# Patient Record
Sex: Female | Born: 1990 | Race: White | Hispanic: No
Health system: Southern US, Community
[De-identification: ages and names within clinical notes are randomized; demographics above are authoritative.]

---
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
## 2016-07-22 ENCOUNTER — Other Ambulatory Visit (HOSPITAL_COMMUNITY)
Admission: RE | Admit: 2016-07-22 | Discharge: 2016-07-22 | Disposition: A | Discharge status: Home / Self Care | Payer: 59 | Source: Ambulatory Visit | Attending: Nurse Practitioner | Admitting: Nurse Practitioner

## 2016-07-22 ENCOUNTER — Other Ambulatory Visit: Payer: Self-pay | Admitting: Nurse Practitioner

## 2016-07-22 DIAGNOSIS — Z01419 Encounter for gynecological examination (general) (routine) without abnormal findings: Secondary | ICD-10-CM | POA: Insufficient documentation

## 2016-07-22 DIAGNOSIS — Z113 Encounter for screening for infections with a predominantly sexual mode of transmission: Secondary | ICD-10-CM | POA: Insufficient documentation

## 2016-07-24 LAB — CYTOLOGY - PAP

## 2017-05-29 DIAGNOSIS — Z800 Family history of malignant neoplasm of digestive organs: Secondary | ICD-10-CM | POA: Diagnosis not present

## 2017-05-29 DIAGNOSIS — K580 Irritable bowel syndrome with diarrhea: Secondary | ICD-10-CM | POA: Diagnosis not present

## 2017-06-24 DIAGNOSIS — R1013 Epigastric pain: Secondary | ICD-10-CM | POA: Diagnosis not present

## 2017-07-02 DIAGNOSIS — R7989 Other specified abnormal findings of blood chemistry: Secondary | ICD-10-CM | POA: Diagnosis not present

## 2017-07-02 DIAGNOSIS — R5383 Other fatigue: Secondary | ICD-10-CM | POA: Diagnosis not present

## 2017-07-02 DIAGNOSIS — E010 Iodine-deficiency related diffuse (endemic) goiter: Secondary | ICD-10-CM | POA: Diagnosis not present

## 2017-07-02 DIAGNOSIS — E559 Vitamin D deficiency, unspecified: Secondary | ICD-10-CM | POA: Diagnosis not present

## 2017-07-02 DIAGNOSIS — R946 Abnormal results of thyroid function studies: Secondary | ICD-10-CM | POA: Diagnosis not present

## 2017-07-03 ENCOUNTER — Other Ambulatory Visit: Payer: Self-pay | Admitting: Family Medicine

## 2017-07-03 DIAGNOSIS — E010 Iodine-deficiency related diffuse (endemic) goiter: Secondary | ICD-10-CM

## 2017-07-13 ENCOUNTER — Ambulatory Visit
Admission: RE | Admit: 2017-07-13 | Discharge: 2017-07-13 | Disposition: A | Discharge status: Home / Self Care | Payer: 59 | Source: Ambulatory Visit | Attending: Family Medicine | Admitting: Family Medicine

## 2017-07-13 DIAGNOSIS — E010 Iodine-deficiency related diffuse (endemic) goiter: Secondary | ICD-10-CM | POA: Diagnosis not present

## 2017-07-18 DIAGNOSIS — J0140 Acute pansinusitis, unspecified: Secondary | ICD-10-CM | POA: Diagnosis not present

## 2017-07-20 DIAGNOSIS — D126 Benign neoplasm of colon, unspecified: Secondary | ICD-10-CM | POA: Diagnosis not present

## 2017-07-20 DIAGNOSIS — K580 Irritable bowel syndrome with diarrhea: Secondary | ICD-10-CM | POA: Diagnosis not present

## 2017-07-20 DIAGNOSIS — Z800 Family history of malignant neoplasm of digestive organs: Secondary | ICD-10-CM | POA: Diagnosis not present

## 2017-07-23 DIAGNOSIS — Z01419 Encounter for gynecological examination (general) (routine) without abnormal findings: Secondary | ICD-10-CM | POA: Diagnosis not present

## 2017-08-18 DIAGNOSIS — E02 Subclinical iodine-deficiency hypothyroidism: Secondary | ICD-10-CM | POA: Diagnosis not present

## 2017-08-18 DIAGNOSIS — E063 Autoimmune thyroiditis: Secondary | ICD-10-CM | POA: Diagnosis not present

## 2017-08-18 DIAGNOSIS — E049 Nontoxic goiter, unspecified: Secondary | ICD-10-CM | POA: Diagnosis not present

## 2018-09-07 ENCOUNTER — Other Ambulatory Visit: Payer: Self-pay | Admitting: Nurse Practitioner

## 2019-09-08 ENCOUNTER — Other Ambulatory Visit: Payer: Self-pay

## 2019-09-08 DIAGNOSIS — Z20822 Contact with and (suspected) exposure to covid-19: Secondary | ICD-10-CM

## 2019-09-10 LAB — NOVEL CORONAVIRUS, NAA: SARS-CoV-2, NAA: NOT DETECTED

## 2019-09-12 ENCOUNTER — Other Ambulatory Visit: Payer: Self-pay | Admitting: Nurse Practitioner

## 2019-09-12 ENCOUNTER — Other Ambulatory Visit (HOSPITAL_COMMUNITY)
Admission: RE | Admit: 2019-09-12 | Discharge: 2019-09-12 | Disposition: A | Discharge status: Home / Self Care | Payer: 59 | Source: Ambulatory Visit | Attending: Nurse Practitioner | Admitting: Nurse Practitioner

## 2019-09-12 DIAGNOSIS — Z124 Encounter for screening for malignant neoplasm of cervix: Secondary | ICD-10-CM | POA: Diagnosis present

## 2019-09-14 LAB — CYTOLOGY - PAP: Diagnosis: NEGATIVE

## 2020-01-09 ENCOUNTER — Ambulatory Visit: Payer: 59 | Attending: Internal Medicine

## 2020-01-09 DIAGNOSIS — Z23 Encounter for immunization: Secondary | ICD-10-CM | POA: Insufficient documentation

## 2020-01-09 NOTE — Progress Notes (Signed)
   Covid-19 Vaccination Clinic  Name:  Patricia Trujillo    MRN: [id number] DOB: July 14, 1991  01/09/2020  Patricia Trujillo was observed post Covid-19 immunization for 15 minutes without incident. She was provided with Vaccine Information Sheet and instruction to access the V-Safe system.   Patricia Trujillo was instructed to call 911 with any severe reactions post vaccine: Marland Kitchen Difficulty breathing  . Swelling of face and throat  . A fast heartbeat  . A bad rash all over body  . Dizziness and weakness   Immunizations Administered    Name Date Dose VIS Date Route   Pfizer COVID-19 Vaccine 01/09/2020  5:04 PM 0.3 mL 10/14/2019 Intramuscular   Manufacturer: [place]   Lot: [id number]   [place]: [id number]

## 2020-01-30 ENCOUNTER — Ambulatory Visit: Payer: 59 | Attending: Internal Medicine

## 2020-01-30 DIAGNOSIS — Z23 Encounter for immunization: Secondary | ICD-10-CM

## 2020-01-30 NOTE — Progress Notes (Signed)
   Covid-19 Vaccination Clinic  Name:  Patricia Trujillo    MRN: [id number] DOB: 09/02/1991  01/30/2020  [name] was observed post Covid-19 immunization for 15 minutes without incident. She was provided with Vaccine Information Sheet and instruction to access the V-Safe system.   [name] was instructed to call 911 with any severe reactions post vaccine: Marland Kitchen Difficulty breathing  . Swelling of face and throat  . A fast heartbeat  . A bad rash all over body  . Dizziness and weakness   Immunizations Administered    Name Date Dose VIS Date Route   Pfizer COVID-19 Vaccine 01/30/2020  8:46 AM 0.3 mL 10/14/2019 Intramuscular   Manufacturer: [place]   Lot: [id number]   [place]: [id number]

## 2020-05-15 ENCOUNTER — Other Ambulatory Visit: Payer: Self-pay | Admitting: Gastroenterology

## 2020-05-15 DIAGNOSIS — K50919 Crohn's disease, unspecified, with unspecified complications: Secondary | ICD-10-CM

## 2020-05-28 ENCOUNTER — Ambulatory Visit
Admission: RE | Admit: 2020-05-28 | Discharge: 2020-05-28 | Disposition: A | Discharge status: Home / Self Care | Payer: 59 | Source: Ambulatory Visit | Attending: Gastroenterology | Admitting: Gastroenterology

## 2020-05-28 ENCOUNTER — Other Ambulatory Visit: Payer: Self-pay

## 2020-05-28 DIAGNOSIS — K50919 Crohn's disease, unspecified, with unspecified complications: Secondary | ICD-10-CM

## 2020-05-28 MED ORDER — IOPAMIDOL (ISOVUE-300) INJECTION 61%
100.0000 mL | Freq: Once | INTRAVENOUS | Status: AC | PRN
  Administered 2020-05-28: 100 mL via INTRAVENOUS

## 2020-06-07 ENCOUNTER — Other Ambulatory Visit: Payer: Self-pay

## 2020-06-07 ENCOUNTER — Ambulatory Visit: Payer: 59 | Admitting: Podiatry

## 2020-06-07 DIAGNOSIS — M79672 Pain in left foot: Secondary | ICD-10-CM

## 2020-06-07 DIAGNOSIS — B078 Other viral warts: Secondary | ICD-10-CM

## 2020-06-07 DIAGNOSIS — B079 Viral wart, unspecified: Secondary | ICD-10-CM

## 2020-06-07 NOTE — Patient Instructions (Signed)
Take dressing off in 8 hours and wash the foot with soap and water. If it is hurting or becomes uncomfortable before the 8 hours, go ahead and remove the bandage and wash the area.  If it blisters, apply antibiotic ointment and a band-aid.  Monitor for any signs/symptoms of infection. Call the office immediately if any occur or go directly to the emergency room. Call with any questions/concerns.   

## 2020-06-10 DIAGNOSIS — B079 Viral wart, unspecified: Secondary | ICD-10-CM | POA: Insufficient documentation

## 2020-06-10 NOTE — Progress Notes (Signed)
Subjective:   Patient ID: Patricia Trujillo, female   DOB: 28 y.o.   MRN: [id number]   HPI 29 year old female presents the office for concerns of warts to her left foot x2.  This is been ongoing for 5 years and she tried over-the-counter treatment with insignificant improvement.  They do become tender at times but denies any redness or drainage or any swelling.  She has no other concerns today.   Review of Systems  All other systems reviewed and are negative.  No past medical history on file.   Current Outpatient Medications:  .  FLUoxetine (PROZAC) 40 MG capsule, Take 40 mg by mouth daily., Disp: , Rfl:  .  levothyroxine (SYNTHROID) 75 MCG tablet, Take 75 mcg by mouth daily before breakfast., Disp: , Rfl:   Allergies  Allergen Reactions  . Hydrocodone Rash        Objective:  Physical Exam  General: AAO x3, NAD  Dermatological: Hyperkeratotic lesions with evidence of verruca noted to the left hallux on the plantar aspect as well as left submetatarsal 2 but also the left fifth toe medial.  No surrounding erythema, ascending cellulitis.  No drainage or pus.  No other lesions identified.  Vascular: Dorsalis Pedis artery and Posterior Tibial artery pedal pulses are 2/4 bilateral with immedate capillary fill time. There is no pain with calf compression, swelling, warmth, erythema.   Neruologic: Grossly intact via light touch bilateral.   Musculoskeletal: No gross boney pedal deformities bilateral. No pain, crepitus, or limitation noted with foot and ankle range of motion bilateral. Muscular strength 5/5 in all groups tested bilateral.  Gait: Unassisted, Nonantalgic.       Assessment:   29 year old female verruca left foot x3    Plan:  -Treatment options discussed including all alternatives, risks, and complications -Etiology of symptoms were discussed -Patient debrided any complications.  Skin was cleaned alcohol and Cantharone was applied followed by an occlusive bandage.  Post  procedure instructions discussed.  Monitoring signs or symptoms of infection.  Return in about 4 weeks (around 07/05/2020).  Trula Slade DPM

## 2020-06-16 ENCOUNTER — Encounter: Payer: Self-pay | Admitting: Podiatry

## 2020-06-17 ENCOUNTER — Other Ambulatory Visit: Payer: Self-pay | Admitting: Podiatry

## 2020-06-17 MED ORDER — CEPHALEXIN 500 MG PO CAPS: 500.0000 mg | ORAL_CAPSULE | Freq: Three times a day (TID) | ORAL | 0 refills | Status: DC

## 2020-06-19 ENCOUNTER — Other Ambulatory Visit: Payer: Self-pay

## 2020-06-19 ENCOUNTER — Encounter: Payer: Self-pay | Admitting: Podiatry

## 2020-06-19 ENCOUNTER — Ambulatory Visit: Payer: 59 | Admitting: Podiatry

## 2020-06-19 DIAGNOSIS — L03032 Cellulitis of left toe: Secondary | ICD-10-CM | POA: Diagnosis not present

## 2020-06-19 DIAGNOSIS — M79672 Pain in left foot: Secondary | ICD-10-CM

## 2020-06-19 DIAGNOSIS — B078 Other viral warts: Secondary | ICD-10-CM | POA: Diagnosis not present

## 2020-06-19 DIAGNOSIS — L02612 Cutaneous abscess of left foot: Secondary | ICD-10-CM | POA: Diagnosis not present

## 2020-06-19 DIAGNOSIS — B079 Viral wart, unspecified: Secondary | ICD-10-CM

## 2020-06-19 NOTE — Patient Instructions (Signed)

## 2020-06-26 NOTE — Progress Notes (Signed)
Subjective: 29 year old female presents the office today for evaluation of warts on her left foot.  She sent a message over the weekend stating that she was having swelling, pain, redness. She was started on antibiotics and since then and soaking in epsom salts she is feeling much better. She has a spot on the 5th toe he states that the blister does not look good but denies any drainage or pus. Denies any systemic complaints such as fevers, chills, nausea, vomiting. No acute changes since last appointment, and no other complaints at this time.   Objective: AAO x3, NAD DP/PT pulses palpable bilaterally, CRT less than 3 seconds hyperkeratotic lesions with evidence of verruca left foot and the ones on plantar aspect of hallux as well submetatarsal 2 appear to be doing much better.  There is also a lesion on the fifth toe the blister was present along where we had applied Cantharone previously.  Upon debridement superficial wound is evident there is no drainage or pus any surrounding erythema, ascending cellulitis.  No drainage or pus or signs of infection. No pain with calf compression, swelling, warmth, erythema  Assessment: Verruca with reaction to Cantharone  Plan: -All treatment options discussed with the patient including all alternatives, risks, complications.  -Debrided the blister, callus left fifth toe that and complications or bleeding.  Apply antibiotic ointment dressing changes daily.  Finish course of antibiotics and monitoring signs or symptoms of infection.  Hold off any further Cantharone application today. -Patient encouraged to call the office with any questions, concerns, change in symptoms.   No follow-ups on file.  Trula Slade DPM

## 2020-07-05 ENCOUNTER — Other Ambulatory Visit: Payer: Self-pay

## 2020-07-05 ENCOUNTER — Ambulatory Visit: Payer: 59 | Admitting: Podiatry

## 2020-07-05 DIAGNOSIS — B078 Other viral warts: Secondary | ICD-10-CM

## 2020-07-05 DIAGNOSIS — B079 Viral wart, unspecified: Secondary | ICD-10-CM

## 2020-07-05 DIAGNOSIS — M79672 Pain in left foot: Secondary | ICD-10-CM

## 2020-07-05 NOTE — Patient Instructions (Signed)
Take dressing off in 8 hours and wash the foot with soap and water. If it is hurting or becomes uncomfortable before the 8 hours, go ahead and remove the bandage and wash the area.  If it blisters, apply antibiotic ointment and a band-aid.  Monitor for any signs/symptoms of infection. Call the office immediately if any occur or go directly to the emergency room. Call with any questions/concerns.   

## 2020-07-11 NOTE — Progress Notes (Signed)
Subjective: 29 year old female presents the office today for follow-up evaluation of warts.  She states that she is doing better.  She is complete the course of antibiotics and no obvious signs of infection she reports.  Pain is also resolved.  She has no other concerns today. Denies any systemic complaints such as fevers, chills, nausea, vomiting. No acute changes since last appointment, and no other complaints at this time.   Objective: AAO x3, NAD DP/PT pulses palpable bilaterally, CRT less than 3 seconds Hyperkeratotic lesion with evidence of verruca submetatarsal x2 of the left foot and minimally to the left fifth toe.  There is no underlying ulceration, drainage or signs of infection there is no edema, erythema.  No other open lesions or preulcerative lesions or verruca identified at this time. No pain with calf compression, swelling, warmth, erythema  Assessment: 29 year old female with a verruca-improving  Plan: -All treatment options discussed with the patient including all alternatives, risks, complications.  -Lesion sharply debrided any complications.  The skin was cleaned with alcohol and the 2 lesion submetatarsal area Cantharone was applied followed by occlusive bandage.  Post procedure instructions discussed.  Monitoring signs or symptoms of infection. -Patient encouraged to call the office with any questions, concerns, change in symptoms.  Trula Slade DPM

## 2021-01-17 ENCOUNTER — Other Ambulatory Visit: Payer: Self-pay

## 2021-01-17 ENCOUNTER — Ambulatory Visit: Payer: 59 | Admitting: Podiatry

## 2021-01-17 DIAGNOSIS — B079 Viral wart, unspecified: Secondary | ICD-10-CM

## 2021-01-17 MED ORDER — FLUOROURACIL 5 % EX CREA: TOPICAL_CREAM | Freq: Two times a day (BID) | CUTANEOUS | 0 refills | Status: DC

## 2021-01-18 ENCOUNTER — Other Ambulatory Visit: Payer: Self-pay | Admitting: Podiatry

## 2021-01-18 NOTE — Telephone Encounter (Signed)
Please advise 

## 2021-01-19 NOTE — Progress Notes (Signed)
Subjective: 30 year old female presents the office today for concerns of continuation of warts.  We have previously applied Cantharone without significant improvement.  She is currently going to [place] next month and she wants to get rid of the warts.  She states there is a new one that is formed.  Denies any systemic complaints such as fevers, chills, nausea, vomiting. No acute changes since last appointment, and no other complaints at this time.   Objective: AAO x3, NAD DP/PT pulses palpable bilaterally, CRT less than 3 seconds Hyperkeratotic lesions with evidence of verruca present to the plantar left submetatarsal area there is also a new one on the fourth toe.  There is no surrounding erythema, edema.  There is no drainage or pus or signs of infection No pain with calf compression, swelling, warmth, erythema  Assessment: Verruca  Plan: -All treatment options discussed with the patient including all alternatives, risks, complications.  -Patient debrided the lesions without any complications.  Areas were cleaned.  Following sterile precautions I utilized laser therapy in order to laser the warts.  She tolerated the procedure well and complications and use proper eye protection. -Prescribed Efudex cream -Patient encouraged to call the office with any questions, concerns, change in symptoms.   RTC 2 weeks or sooner if needed  Trula Slade DPM

## 2021-01-29 ENCOUNTER — Telehealth: Payer: Self-pay | Admitting: *Deleted

## 2021-01-29 NOTE — Telephone Encounter (Signed)
Called and left a message for the patient asking if the patient got efudex due to Dr Jacqualyn Posey never got authorization but just wanted to see if the patient got it. Lattie Haw

## 2021-01-31 ENCOUNTER — Ambulatory Visit: Payer: 59 | Admitting: Podiatrist

## 2021-01-31 NOTE — Telephone Encounter (Signed)
Lattie Haw- did she call back to see if she got this? If not can you see about the prior authorization

## 2021-02-05 ENCOUNTER — Other Ambulatory Visit: Payer: Self-pay

## 2021-02-05 ENCOUNTER — Encounter: Payer: Self-pay | Admitting: Podiatrist

## 2021-02-05 ENCOUNTER — Ambulatory Visit: Payer: 59 | Admitting: Podiatrist

## 2021-02-05 DIAGNOSIS — B079 Viral wart, unspecified: Secondary | ICD-10-CM

## 2021-02-05 NOTE — Progress Notes (Signed)
Chief Complaint  Patient presents with  . Plantar Warts    2 week  wart f/u - left foot      HPI: Patient is 30 y.o. female who presents today for follow up of warts left foot. She relates she has been using the Efudex cream.  States the lasering helped the lesions on the top. The ones on the bottom and between the fifth and fourth toes are still present but appear improved.     Allergies  Allergen Reactions  . Hydrocodone Rash    Review of systems is reviewed and negative.   Physical Exam  Patient is awake, alert, and oriented x 3.  In no acute distress.    Vascular status is intact with palpable pedal pulses DP and PT bilateral and capillary refill time less than 3 seconds bilateral.  No edema or erythema noted.  Neurological exam reveals epicritic and protective sensation grossly intact bilateral.  Dermatological exam reveals skin is supple and dry to bilateral feet.  No open lesions present.   Musculoskeletal exam: Musculature intact with dorsiflexion, plantarflexion, inversion, eversion. Ankle and First MPJ joint range of motion normal.   veruccous lesions appear to be stable miltiple pinpoint capillaries are still present in multiple lesions of the left foot . Photo taken today for comparison:       Assessment: verucca - miltiple left foot  Plan: Discussed treatment options and alternatives.  Discussed laser vs topical therapies and the patient would like to try the laser again vs. The canthacur.  I don't know how to use the laser so I am having her return to see Dr. Jacqualyn Posey in the next 2 weeks.  In the meantime, she will continue using the efudex cream as instructed.  Patient was in agreement with this plan for the next visit.

## 2021-02-14 ENCOUNTER — Other Ambulatory Visit: Payer: Self-pay

## 2021-02-14 ENCOUNTER — Ambulatory Visit (INDEPENDENT_AMBULATORY_CARE_PROVIDER_SITE_OTHER): Payer: 59

## 2021-02-14 DIAGNOSIS — B079 Viral wart, unspecified: Secondary | ICD-10-CM | POA: Diagnosis not present

## 2021-02-14 NOTE — Progress Notes (Signed)
Patient presents today for laser treatment for plantar warts on the Left foot. There are 4 lesions.  Dr. Jacqualyn Posey patient.  All other systems are negative.  Lesions were debrided superficially. Laser therapy was administered to the left foot. The patient tolerated the treatment well. All safety precautions were in place.   Follow up in 2 weeks for laser # 3.

## 2021-03-04 ENCOUNTER — Ambulatory Visit (INDEPENDENT_AMBULATORY_CARE_PROVIDER_SITE_OTHER): Payer: 59

## 2021-03-04 ENCOUNTER — Other Ambulatory Visit: Payer: Self-pay

## 2021-03-04 DIAGNOSIS — B079 Viral wart, unspecified: Secondary | ICD-10-CM

## 2021-03-04 NOTE — Progress Notes (Signed)
Patient presents today for laser treatment for plantar warts on the Left foot. There are 4 lesions.  Dr. Jacqualyn Posey patient.  All other systems are negative.  Lesions were debrided superficially. Laser therapy was administered to the left foot. The patient tolerated the treatment well. All safety precautions were in place.   Follow up in 2 weeks for laser # 4.

## 2021-03-18 ENCOUNTER — Other Ambulatory Visit: Payer: Self-pay

## 2021-03-18 ENCOUNTER — Ambulatory Visit: Payer: 59

## 2021-03-18 DIAGNOSIS — B079 Viral wart, unspecified: Secondary | ICD-10-CM

## 2021-03-18 NOTE — Progress Notes (Signed)
Patient presents today for laser treatment for plantar warts on the Left foot. There are 4 lesions.  Dr. Jacqualyn Posey patient.  All other systems are negative.  Lesions were debrided superficially. Laser therapy was administered to the left foot. The patient tolerated the treatment well. All safety precautions were in place.   Follow up in 3 weeks with Dr. Jacqualyn Posey.

## 2021-04-08 ENCOUNTER — Ambulatory Visit: Payer: 59 | Admitting: Podiatry

## 2021-04-08 ENCOUNTER — Other Ambulatory Visit: Payer: Self-pay

## 2021-04-08 DIAGNOSIS — B079 Viral wart, unspecified: Secondary | ICD-10-CM

## 2021-04-13 NOTE — Progress Notes (Signed)
Subjective: 30 year old female presents the office today for follow-up evaluation of warts.  She states that the laser treatments have been helpful and she has noticed improvement.  Denies any pain, swelling or redness or any drainage and she has had no complications from the laser she reports.  Objective: AAO x3, NAD DP/PT pulses palpable bilaterally, CRT less than 3 seconds Hyperkeratotic lesions with evidence of verruca present to the plantar left submetatarsal area and also on the fourth toe.  Overall appear to be improved but still present and is much more superficial small.  There is no pain, swelling or redness or any drainage.  No pain with calf compression, swelling, warmth, erythema  Assessment: Verruca-improving  Plan: -All treatment options discussed with the patient including all alternatives, risks, complications.  -Today I sharply debrided the lesions without any complications.  Areas were cleaned.  Following sterile precautions I utilized laser therapy in order to laser the warts.  She tolerated the procedure well and complications and use proper eye protection. -Patient encouraged to call the office with any questions, concerns, change in symptoms.   RTC 2 weeks or sooner if needed  Trula Slade DPM

## 2021-05-09 ENCOUNTER — Ambulatory Visit: Payer: 59 | Admitting: Podiatry

## 2021-05-09 ENCOUNTER — Other Ambulatory Visit: Payer: Self-pay | Admitting: Podiatry

## 2021-05-09 ENCOUNTER — Other Ambulatory Visit: Payer: Self-pay

## 2021-05-09 DIAGNOSIS — B079 Viral wart, unspecified: Secondary | ICD-10-CM | POA: Diagnosis not present

## 2021-05-09 MED ORDER — SALICYLIC ACID 27.5 % EX LIQD: 1.0000 "application " | Freq: Every day | CUTANEOUS | 1 refills | Status: DC

## 2021-05-09 NOTE — Telephone Encounter (Signed)
Please advise 

## 2021-05-09 NOTE — Patient Instructions (Signed)
Salicylic Acid topical gel, cream, lotion, solution What is this medication? SALICYCLIC ACID (SAL i SIL ik AS id) breaks down layers of thick skin. It treats common and plantar warts, psoriasis, calluses, and corns. It also treatsor prevents acne. This medicine may be used for other purposes; ask your health care provider orpharmacist if you have questions. COMMON BRAND NAME(S): [place] Jabs, Clear Away Liquid, Clearasil Total Control, Clearasil Ultra Scrub, Compound W, Corn/Callus Remover, DermacinRx Atrix, Dermarest Psoriasis Moisturizer, Dermarest Psoriasis Overnight Treatment, Dermarest Psoriasis Scalp Treatment, Dermarest Psoriasis Skin Treatment, Dr.Scholl's Dual Action FREEZE AWAY, DuoFilm Wart Remover, [place], [place], [place]ris[place], [place], [employer] Callus & [employer], Neutrogena Acne [place], [place], [place], [place], [employer], [place], [place], [place], [employer], [place], Merrill Lynch, [place], [employer], [place], [employer] 2in 1 Anti-Dandruff, [place], [place], Wart-Off, Niel Hummer What should I tell my care team before I take this medication? They need to know if you have any of these conditions: child with chickenpox, the flu, or other viral infection kidney disease liver disease an unusual or allergic reaction to salicylic acid, other medicines, foods, dyes, or preservatives pregnant or trying to get pregnant breast-feeding How should I use this medication? This medicine is for external use only. Follow the directions on the label. Do not apply to raw or irritated skin. Avoid getting medicine in your eyes, lips, nose, mouth, or other sensitive areas. Use this medicine at regular intervals.Do not use more often than directed. Talk to your pediatrician regarding the use of this medicine in children. Special care may be needed. This medicine is not approved for use in childrenunder 30 years old. Overdosage: If you think you have taken too much of this medicine contact apoison  control center or emergency room at once. NOTE: This medicine is only for you. Do not share this medicine with others. What if I miss a dose? If you miss a dose, use it as soon as you can. If it is almost time for yournext dose, use only that dose. Do not use double or extra doses. What may interact with this medication? medicines that change urine pH like ammonium chloride, sodium bicarbonate, and others medicines that treat or prevent blood clots like warfarin methotrexate pyrazinamide some medicines for diabetes some medicines for gout steroid medicines like prednisone or cortisone This list may not describe all possible interactions. Give your health care provider a list of all the medicines, herbs, non-prescription drugs, or dietary supplements you use. Also tell them if you smoke, drink alcohol, or use illegaldrugs. Some items may interact with your medicine. What should I watch for while using this medication? Tell your doctor is your symptoms do not get better or if they get worse. This medicine can make you more sensitive to the sun. Keep out of the sun. If you cannot avoid being in the sun, wear protective clothing and use sunscreen.Do not use sun lamps or tanning beds/booths. Use of this medicine in children under 12 years or in patients with kidney or liver disease may increase the risk of serious side effects. These patients should not use this medicine over large areas of skin. If you notice symptoms such as nausea, vomiting, dizziness, loss of hearing, ringing in the ears, unusual weakness or tiredness, fast or labored breathing, diarrhea, or confusion, stop using this medicine and contact your doctor or health careprofessional. What side effects may I notice from receiving this medication? Side effects that you should report to your doctor or health care professionalas soon as possible: allergic reactions  like skin rash, itching or hives, swelling of the face, lips, or tongue Side  effects that usually do not require medical attention (report to yourdoctor or health care professional if they continue or are bothersome): skin irritation This list may not describe all possible side effects. Call your doctor for medical advice about side effects. You may report side effects to FDA at1-800-FDA-1088. Where should I keep my medication? Keep out of the reach of children. Store at room temperature between 15 and 30 degrees C (59 and 86 degrees F). Donot freeze. Throw away any unused medicine after the expiration date. NOTE: This sheet is a summary. It may not cover all possible information. If you have questions about this medicine, talk to your doctor, pharmacist, orhealth care provider.  2022 Elsevier/Gold Standard (2019-07-08 14:15:35)

## 2021-05-13 ENCOUNTER — Other Ambulatory Visit: Payer: Self-pay | Admitting: Podiatry

## 2021-05-13 NOTE — Progress Notes (Signed)
error 

## 2021-05-14 NOTE — Progress Notes (Signed)
Subjective: 30 year old female presents the office today for follow-up evaluation of warts today ongoing for a long time.  She states they are doing better and her main concern now is that the fourth and fifth toes rubbing and she has a wart on the medial fifth toe.   Objective: AAO x3, NAD DP/PT pulses palpable bilaterally, CRT less than 3 seconds Hyperkeratotic lesions with evidence of verruca present to the plantar left submetatarsal area and also on the fourth toe.  Overall appear to be improved but still present and is much more superficial small.  Fourth and fifth toes are rubbing causing discomfort there is no pain, swelling or redness or any drainage.  No pain with calf compression, swelling, warmth, erythema  Assessment: Verruca-improving  Plan: -All treatment options discussed with the patient including all alternatives, risks, complications.  -Today I sharply debrided the lesions without any complications.  Areas were cleaned.  Following sterile precautions I utilized laser therapy in order to laser the warts.  She tolerated the procedure well and complications and use proper eye protection. -Prescribed salicylic acid. -Patient encouraged to call the office with any questions, concerns, change in symptoms.   Trula Slade DPM

## 2021-06-13 ENCOUNTER — Ambulatory Visit: Payer: 59 | Admitting: Podiatry

## 2021-06-20 ENCOUNTER — Other Ambulatory Visit: Payer: Self-pay

## 2021-06-20 ENCOUNTER — Ambulatory Visit (INDEPENDENT_AMBULATORY_CARE_PROVIDER_SITE_OTHER): Payer: 59 | Admitting: Podiatry

## 2021-06-20 DIAGNOSIS — B079 Viral wart, unspecified: Secondary | ICD-10-CM | POA: Diagnosis not present

## 2021-06-22 NOTE — Progress Notes (Signed)
Subjective: 30 year old female presents the office today for follow-up evaluation of warts.  She states they are doing well except for the left fifth toe on the medial portion of the toe she states it rubs which causes irritation but denies any swelling or redness or any drainage.  She still using a compound cream.  She has no other concerns today.   Objective: AAO x3, NAD DP/PT pulses palpable bilaterally, CRT less than 3 seconds Hyperkeratotic lesions with evidence of verruca present to the plantar left submetatarsal area and also on the fifth medial toe.  Overall appear to be improved but still present and is much more superficial small. The lesion on the fifth toes rubbing the fourth toe causing discomfort. No pain with calf compression, swelling, warmth, erythema  Assessment: Verruca  Plan: -All treatment options discussed with the patient including all alternatives, risks, complications.  -Today I sharply debrided the lesions without any complications.  Areas were cleaned.  Following standard precautions I utilized laser therapy in order to laser the warts.  She tolerated the procedure well and complications and use proper eye protection. -She can alternate the salicylic acid with the urea cream which I dispensed today for her.  Discussed side effects of this any skin irritation stop using the cream to let me know. -Patient encouraged to call the office with any questions, concerns, change in symptoms.   Trula Slade DPM

## 2021-08-06 ENCOUNTER — Ambulatory Visit (INDEPENDENT_AMBULATORY_CARE_PROVIDER_SITE_OTHER): Payer: 59 | Admitting: Podiatry

## 2021-08-06 ENCOUNTER — Other Ambulatory Visit: Payer: Self-pay

## 2021-08-06 ENCOUNTER — Encounter: Payer: Self-pay | Admitting: Podiatry

## 2021-08-06 DIAGNOSIS — E063 Autoimmune thyroiditis: Secondary | ICD-10-CM | POA: Insufficient documentation

## 2021-08-06 DIAGNOSIS — E669 Obesity, unspecified: Secondary | ICD-10-CM | POA: Insufficient documentation

## 2021-08-06 DIAGNOSIS — E049 Nontoxic goiter, unspecified: Secondary | ICD-10-CM | POA: Insufficient documentation

## 2021-08-06 DIAGNOSIS — B079 Viral wart, unspecified: Secondary | ICD-10-CM

## 2021-08-06 DIAGNOSIS — E02 Subclinical iodine-deficiency hypothyroidism: Secondary | ICD-10-CM | POA: Insufficient documentation

## 2021-08-14 NOTE — Progress Notes (Signed)
Subjective: 30 year old female presents the office today for follow-up evaluation of warts.  She is underwent multiple laser treatments as well as topical medication resolution.  Although they are better still present.  Some occasional discomfort.  No swelling redness or drainage.   Objective: AAO x3, NAD DP/PT pulses palpable bilaterally, CRT less than 3 seconds Hyperkeratotic lesions with evidence of verruca present to the plantar left submetatarsal area and also on the fifth medial toe.  Seen about think over the last appointment.  The lesion on the fifth toes rubbing the fourth toe causing discomfort. No pain with calf compression, swelling, warmth, erythema  Assessment: Verruca  Plan: -All treatment options discussed with the patient including all alternatives, risks, complications.  -Debrided the lesions without any complications or bleeding.  We will hold off on further lasering this has not been overly helpful.  She continues compound cream but mother try to help her come back in once we can get Canthrone in the office.  Also can consider taking her to the surgical center for lasering.  We discussed removal but given the lesion on the medial fifth toe and hesitant to excise that lesion given the size noted tissue that have to be removed.  Trula Slade DPM

## 2021-08-20 ENCOUNTER — Telehealth: Payer: Self-pay | Admitting: *Deleted

## 2021-08-20 NOTE — Telephone Encounter (Signed)
Called patient and told her that the salicylic acid solution is ready for pick up from front desk, verbalized understanding.

## 2022-01-02 ENCOUNTER — Other Ambulatory Visit: Payer: Self-pay | Admitting: Physician Assistant

## 2022-01-02 ENCOUNTER — Ambulatory Visit
Admission: RE | Admit: 2022-01-02 | Discharge: 2022-01-02 | Disposition: A | Discharge status: Home / Self Care | Payer: 59 | Source: Ambulatory Visit | Attending: Physician Assistant | Admitting: Physician Assistant

## 2022-01-02 DIAGNOSIS — R109 Unspecified abdominal pain: Secondary | ICD-10-CM

## 2022-02-21 ENCOUNTER — Encounter: Payer: Self-pay | Admitting: Podiatry

## 2022-02-21 ENCOUNTER — Ambulatory Visit (INDEPENDENT_AMBULATORY_CARE_PROVIDER_SITE_OTHER): Payer: 59 | Admitting: Podiatry

## 2022-02-21 DIAGNOSIS — B079 Viral wart, unspecified: Secondary | ICD-10-CM | POA: Diagnosis not present

## 2022-02-21 NOTE — Patient Instructions (Signed)
Take dressing off in 8 hours and wash the foot with soap and water. If it is hurting or becomes uncomfortable before the 8 hours, go ahead and remove the bandage and wash the area.  If it blisters, apply antibiotic ointment and a band-aid.  Monitor for any signs/symptoms of infection. Call the office immediately if any occur or go directly to the emergency room. Call with any questions/concerns.   

## 2022-02-24 ENCOUNTER — Encounter: Payer: Self-pay | Admitting: Podiatry

## 2022-02-25 NOTE — Progress Notes (Signed)
Subjective: ?31 year old female presents the office today for follow-up evaluation of warts.  She states that they are still not doing.  Left fifth toe looks "jacked up".  Causes occasional discomfort most of the fifth toe lesion.  No drainage or pus.  She is tried numerous conservative treatment without significant resolution.  ? ?Objective: ?AAO x3, NAD ?DP/PT pulses palpable bilaterally, CRT less than 3 seconds ?Hyperkeratotic lesions with evidence of verruca present to the plantar left submetatarsal area and also on the fifth medial toe.  They do seem to be doing about the same compared to last appointment.  There is no fifth toe does cause discomfort.  No pain with calf compression, swelling, warmth, erythema ? ?Assessment: ?Verruca ? ?Plan: ?-All treatment options discussed with the patient including all alternatives, risks, complications.  ?-Debrided the lesions without any complications or bleeding.  Skin was cleaned with alcohol and Cantharone Plus was applied to the fifth toe lesion as well as the plantar lesions with any complications.  Postinjection care discussed.  Monitor for signs or symptoms of infection. ?-We will consider alternative treatments including further CO2 lasering or other topicals but we will see how this does and then further proceed. ? ?Trula Slade DPM ? ?

## 2022-03-14 ENCOUNTER — Other Ambulatory Visit: Payer: Self-pay | Admitting: Podiatry

## 2022-03-14 MED ORDER — IMIQUIMOD 5 % EX CREA: TOPICAL_CREAM | CUTANEOUS | 0 refills | Status: DC

## 2022-03-14 NOTE — Telephone Encounter (Signed)
Please advise 

## 2022-04-23 ENCOUNTER — Encounter: Payer: Self-pay | Admitting: Podiatry

## 2022-05-19 ENCOUNTER — Ambulatory Visit: Payer: 59 | Admitting: Podiatry

## 2022-05-19 DIAGNOSIS — B079 Viral wart, unspecified: Secondary | ICD-10-CM

## 2022-05-19 NOTE — Patient Instructions (Signed)

## 2022-05-19 NOTE — Progress Notes (Signed)
Subjective: 31 year old female presents the office today for follow-up evaluation of warts.  States they are doing about the same.  We discussed lasering them in the surgical center she wants to proceed with this.  Objective: AAO x3, NAD DP/PT pulses palpable bilaterally, CRT less than 3 seconds Hyperkeratotic lesion with evidence of capillary budding/pinpoint bleeding, verruca on the medial aspect of the left fifth toe, submetatarsal area x3.  No ulcerations noted.  No edema, erythema. No pain with calf compression, swelling, warmth, erythema  Assessment: Verruca  Plan: -All treatment options discussed with the patient including all alternatives, risks, complications.  -Debrided the lesions without any complications.  Clean the lesions with alcohol and allowed to dry.  Cantharone Plus was applied followed by an occlusive bandage.  Postprocedure instructions discussed.  Monitor for any signs or symptoms of infection. -She was to proceed with curettage, laser in the operating room.  We discussed the procedures postoperative course.  We will plan on doing this for her.  -The incision placement as well as the postoperative course was discussed with the patient. I discussed risks of the surgery which include, but not limited to, infection, bleeding, pain, swelling, need for further surgery, delayed or nonhealing, painful or ugly scar, numbness or sensation changes, over/under correction, recurrence, transfer lesions, further deformity, DVT/PE, loss of toe/foot. Patient understands these risks and wishes to proceed with surgery. The surgical consent was reviewed with the patient all 3 pages were signed. No promises or guarantees were given to the outcome of the procedure. All questions were answered to the best of my ability. Before the surgery the patient was encouraged to call the office if there is any further questions. The surgery will be performed at the [facility] on an outpatient basis.  Trula Slade DPM

## 2022-05-20 ENCOUNTER — Other Ambulatory Visit (HOSPITAL_COMMUNITY)
Admission: RE | Admit: 2022-05-20 | Discharge: 2022-05-20 | Disposition: A | Discharge status: Home / Self Care | Payer: 59 | Source: Ambulatory Visit | Attending: Nurse Practitioner | Admitting: Nurse Practitioner

## 2022-05-20 ENCOUNTER — Other Ambulatory Visit: Payer: Self-pay | Admitting: Nurse Practitioner

## 2022-05-20 DIAGNOSIS — Z124 Encounter for screening for malignant neoplasm of cervix: Secondary | ICD-10-CM | POA: Insufficient documentation

## 2022-05-21 LAB — CYTOLOGY - PAP
Adequacy: ABSENT
Comment: NEGATIVE
Diagnosis: NEGATIVE
High risk HPV: NEGATIVE

## 2022-06-04 ENCOUNTER — Telehealth: Payer: Self-pay | Admitting: Urology

## 2022-06-04 NOTE — Telephone Encounter (Signed)
DOS - 06/27/22  LASER WART LEFT --- 17110  [employer] EFFECTIVE DATE - 06/03/21  PLAN DEDUCTIBLE - $600.00 W/ $517.04 REMAINING OUT OF POCKET - $2,800.00 W/ $1,483.74 REMAINING COINSURANCE - 25% COPAY - $0.00   PER [employer]'S AUTOMATIVE SYSTEM FOR CPT CODE 02111 NO PRIOR AUTH IS REQUIRED.  REF # [id number]

## 2022-06-23 ENCOUNTER — Encounter: Payer: Self-pay | Admitting: Podiatry

## 2022-07-03 ENCOUNTER — Encounter: Payer: 59 | Admitting: Podiatry

## 2022-07-10 ENCOUNTER — Encounter: Payer: 59 | Admitting: Podiatry

## 2022-07-10 ENCOUNTER — Other Ambulatory Visit: Payer: 59

## 2022-07-18 ENCOUNTER — Ambulatory Visit: Payer: 59 | Admitting: Podiatry

## 2022-07-18 DIAGNOSIS — B079 Viral wart, unspecified: Secondary | ICD-10-CM | POA: Diagnosis not present

## 2022-07-18 NOTE — Patient Instructions (Signed)
Take dressing off in 8 hours and wash the foot with soap and water. If it is hurting or becomes uncomfortable before the 8 hours, go ahead and remove the bandage and wash the area.  If it blisters, apply antibiotic ointment and a band-aid.  Monitor for any signs/symptoms of infection. Call the office immediately if any occur or go directly to the emergency room. Call with any questions/concerns.   

## 2022-07-21 NOTE — Progress Notes (Signed)
Subjective: 31 year old female presents the office today for follow-up evaluation of warts.  She states they are actually doing better now so she canceled the surgery.  The wounds on the fifth toe that were causing a lot of the issues that come off she only has lesions on the bottom of her foot now on the ball.  No swelling redness or drainage.  No open lesions.  No other concerns.     Objective: AAO x3, NAD DP/PT pulses palpable bilaterally, CRT less than 3 seconds On the plantar aspect left foot submetatarsal area are 2 areas of hyperkeratotic tissue with capillary budding consistent with a verruca.  There is no edema, erythema or signs of infection.  No open lesions. No pain with calf compression, swelling, warmth, erythema  Assessment: Verruca  Plan: -All treatment options discussed with the patient including all alternatives, risks, complications.  -Overall direction doing better support and hold off on the surgical lasering.  I sharply debrided lesions with any complications.  Cleaned the skin with alcohol.  Cantharone Plus was applied followed by an occlusive bandage.  Postprocedure instructions discussed.  Monitor for any signs or symptoms of infection.  Return if symptoms worsen or fail to improve.  Trula Slade DPM

## 2022-09-10 IMAGING — CR DG ABDOMEN 2V
2 series · 2 of 2 positions shown · non-contrast
Comparison: CT enterography 05/28/2020

CLINICAL DATA: Abdominal pain. Patient reports right-sided pain for
about 3 weeks.

EXAM:
ABDOMEN - 2 VIEW

[w abdomen upright]
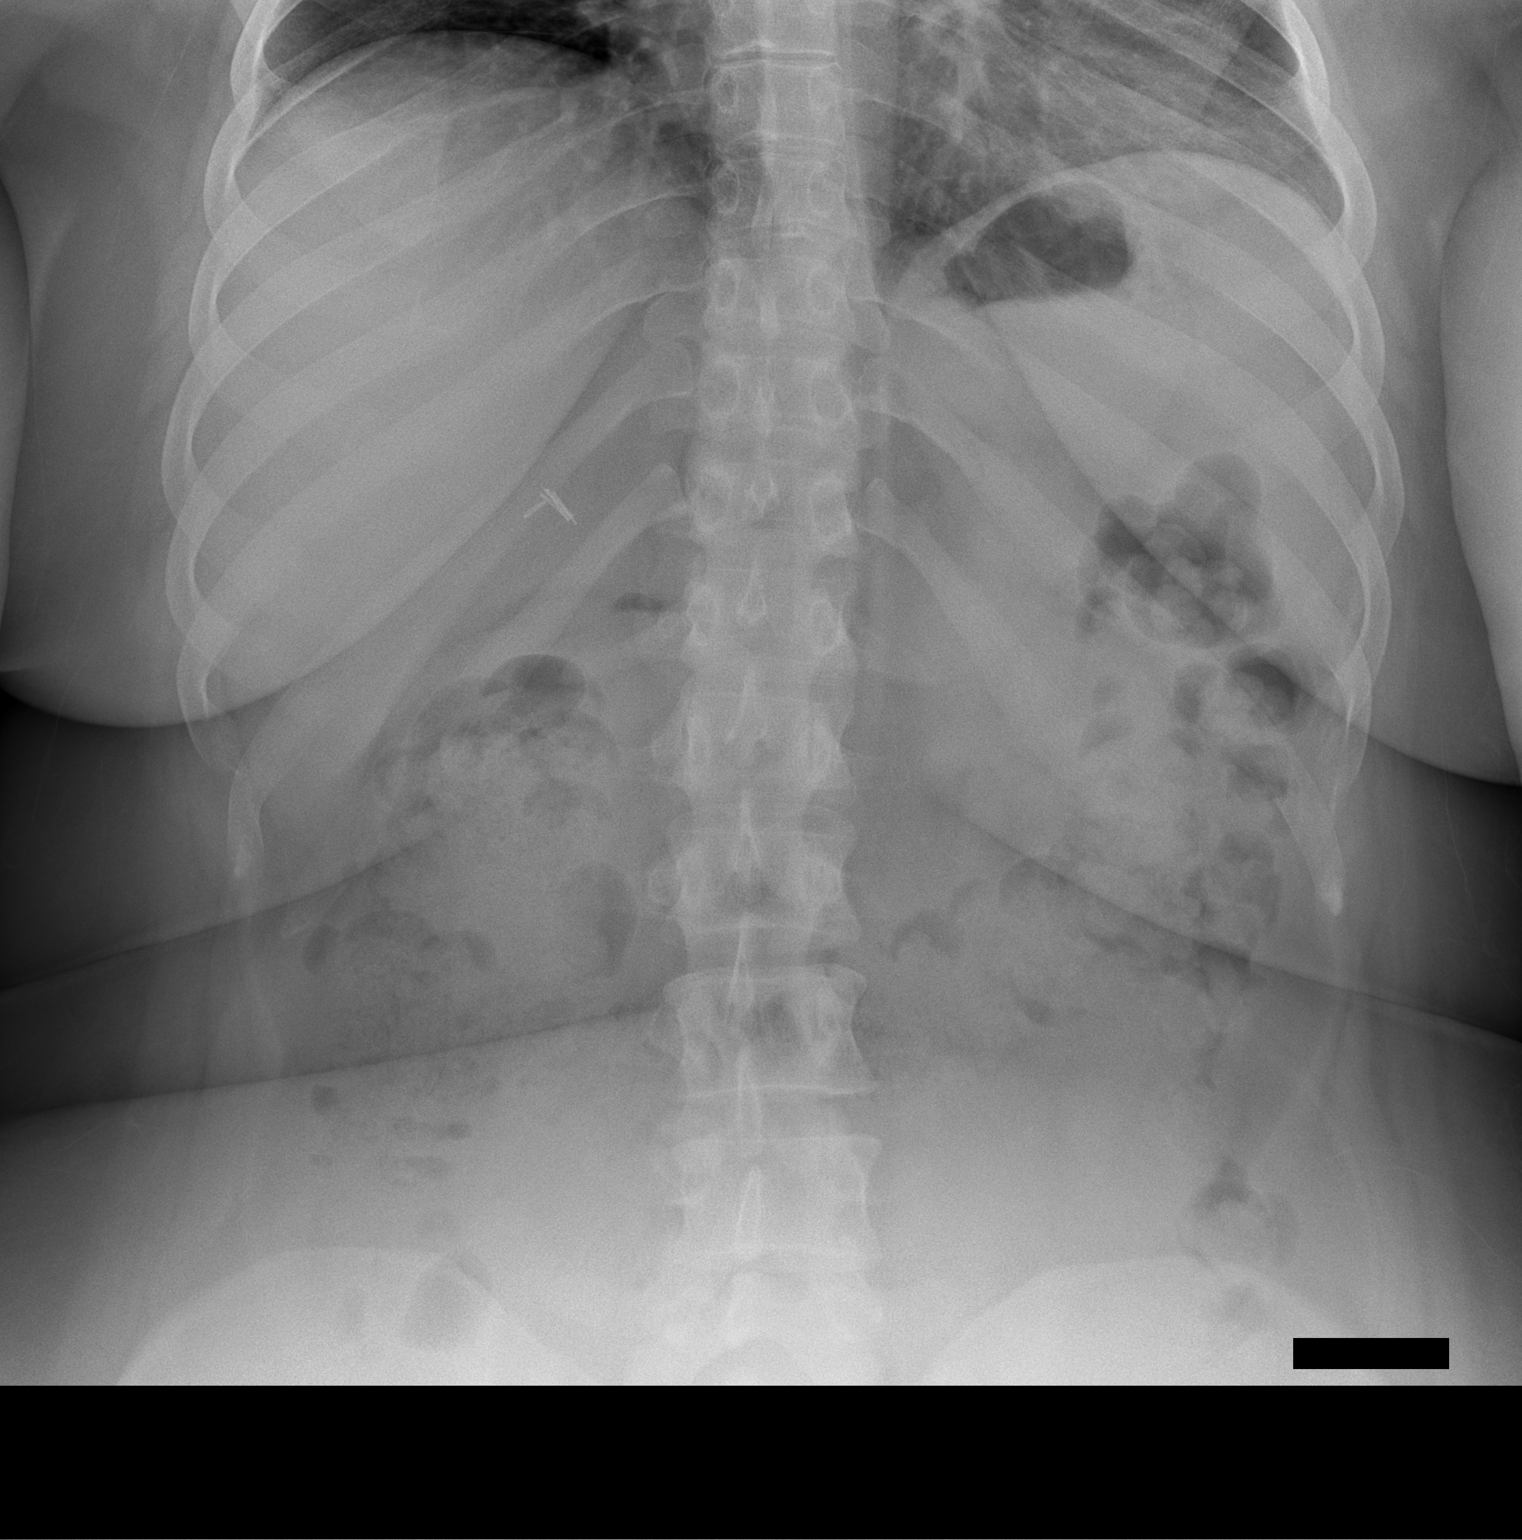

[t abdomen supine]
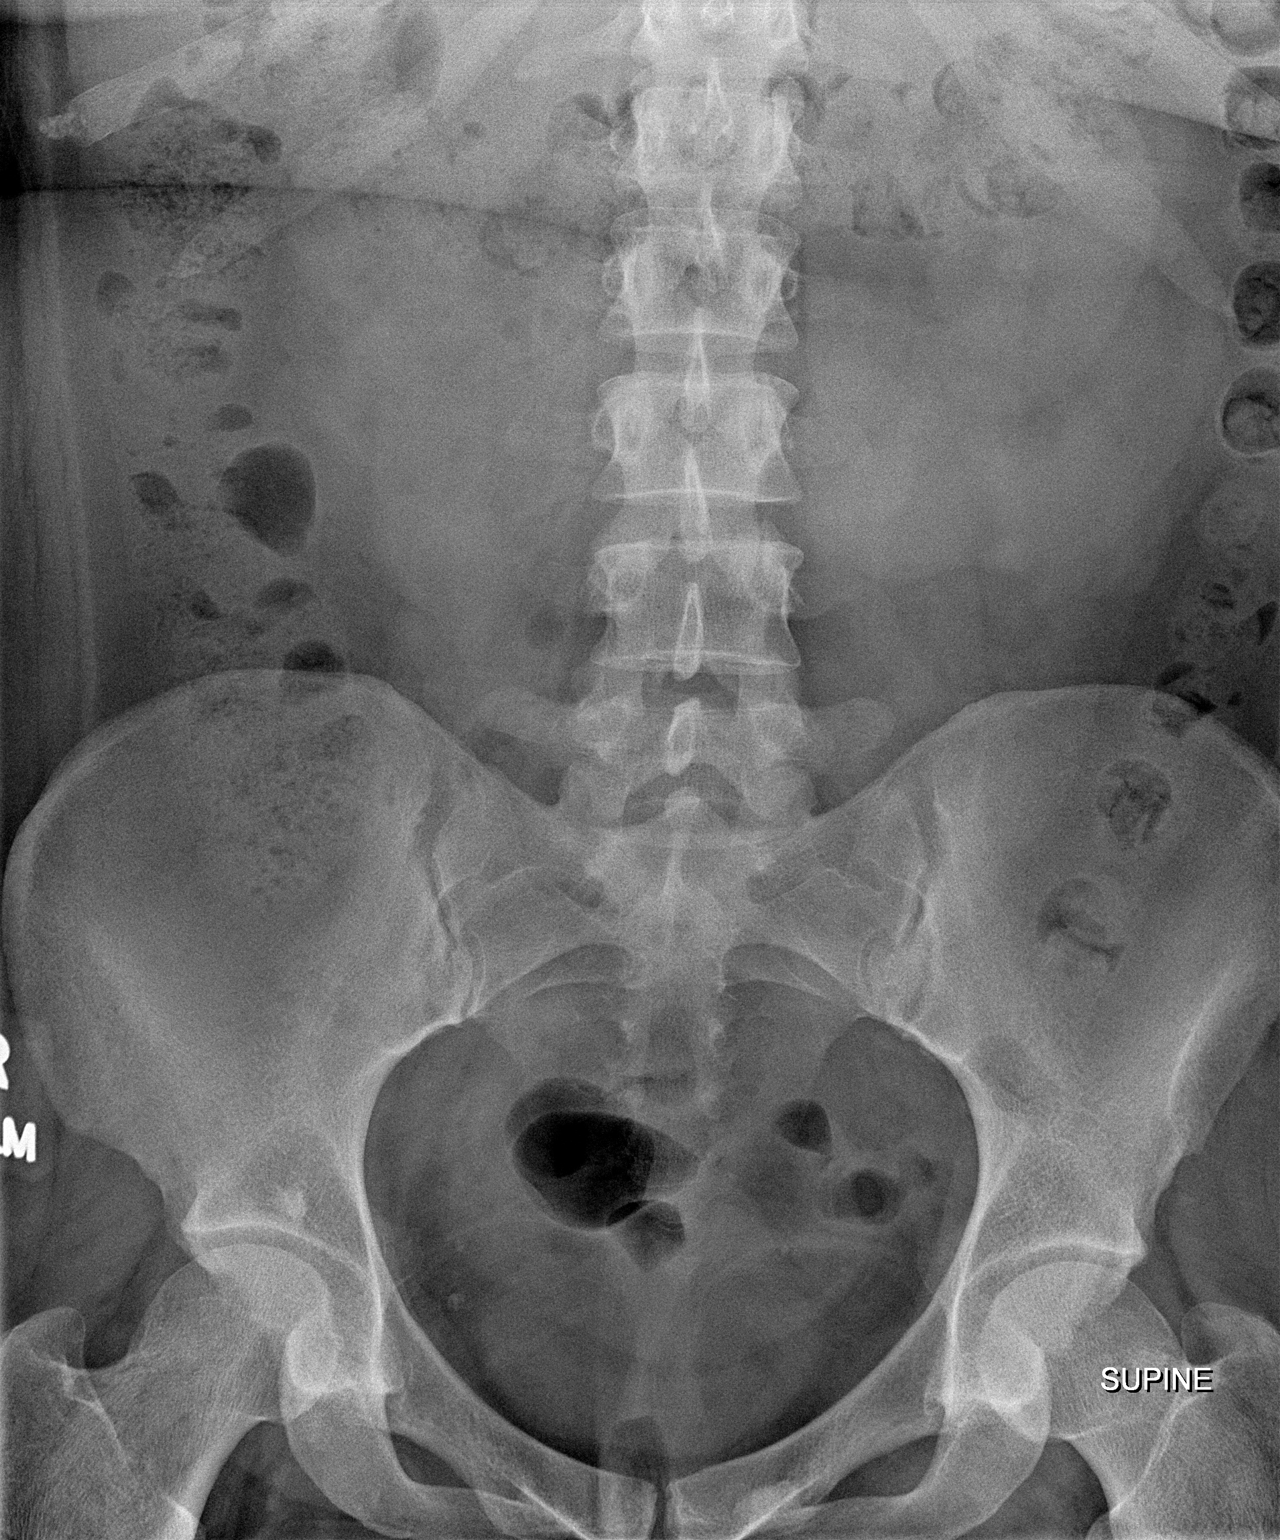

[2 of 2 positions shown; findings below may reference images not displayed]

FINDINGS: Supine and upright views of the abdomen obtained. No free
intra-abdominal air. No bowel dilatation or bowel air-fluid levels.
Moderate colonic stool burden. No abnormal rectal distention.
Cholecystectomy clips in the right upper quadrant. No visualized
radiopaque calculi. Right pelvic calcifications consistent
phleboliths. Right acetabular bone island.
IMPRESSION: Normal bowel gas pattern with moderate volume of colonic stool.

## 2023-03-12 ENCOUNTER — Ambulatory Visit
Admission: RE | Admit: 2023-03-12 | Discharge: 2023-03-12 | Disposition: A | Discharge status: Home / Self Care | Payer: 59 | Source: Ambulatory Visit | Attending: Family Medicine | Admitting: Family Medicine

## 2023-03-12 ENCOUNTER — Other Ambulatory Visit: Payer: Self-pay | Admitting: Family Medicine

## 2023-03-12 DIAGNOSIS — R051 Acute cough: Secondary | ICD-10-CM

## 2024-01-15 ENCOUNTER — Other Ambulatory Visit: Payer: Self-pay | Admitting: Family Medicine

## 2024-01-15 DIAGNOSIS — R519 Headache, unspecified: Secondary | ICD-10-CM

## 2024-01-21 ENCOUNTER — Ambulatory Visit: Admitting: Podiatry

## 2024-01-21 ENCOUNTER — Encounter: Payer: Self-pay | Admitting: Podiatry

## 2024-01-21 DIAGNOSIS — B079 Viral wart, unspecified: Secondary | ICD-10-CM

## 2024-01-21 NOTE — Progress Notes (Signed)
 Subjective:  Chief Complaint  Patient presents with   Plantar Warts    Rm#11 Plantar Warts of left foot     33 year old female presents the office today with concerns of a new wart on the left foot.  Is not causing any pain but she recently noticed it.  No recent treatment.  No open lesions.  No injuries.  No other concerns.  Objective: AAO x3, NAD DP/PT pulses palpable bilaterally, CRT less than 3 seconds On the plantar aspect the left midfoot just proximal to the metatarsal heads there is a punctate annular hyperkeratotic lesion with capillary budding evidence of verruca.  No edema, erythema or signs of infection.  No open lesions. No pain with calf compression, swelling, warmth, erythema  Assessment: 33 year old female with improved left foot  Plan: -All treatment options discussed with the patient including all alternatives, risks, complications.  -Sharply debrided to any complications.  Cleaned the skin with alcohol.  Cantharone was applied followed by occlusive bandage.  Postprocedure instructions discussed.  Monitor for any signs or symptoms of infection. -Patient encouraged to call the office with any questions, concerns, change in symptoms.   Return if symptoms worsen or fail to improve.  Vivi Barrack DPM

## 2024-01-21 NOTE — Patient Instructions (Signed)
 Take dressing off in 8 hours and wash the foot with soap and water. If it is hurting or becomes uncomfortable before the 8 hours, go ahead and remove the bandage and wash the area.  If it blisters, apply antibiotic ointment and a band-aid.  Monitor for any signs/symptoms of infection. Call the office immediately if any occur or go directly to the emergency room. Call with any questions/concerns.

## 2024-02-04 ENCOUNTER — Encounter: Payer: Self-pay | Admitting: Family Medicine

## 2024-02-06 ENCOUNTER — Ambulatory Visit
Admission: RE | Admit: 2024-02-06 | Discharge: 2024-02-06 | Disposition: A | Discharge status: Home / Self Care | Source: Ambulatory Visit | Attending: Family Medicine | Admitting: Family Medicine

## 2024-02-06 DIAGNOSIS — R519 Headache, unspecified: Secondary | ICD-10-CM

## 2024-03-14 ENCOUNTER — Ambulatory Visit: Admitting: Diagnostic Neuroimaging

## 2024-03-14 ENCOUNTER — Encounter: Payer: Self-pay | Admitting: Diagnostic Neuroimaging

## 2024-03-14 VITALS — BP 127/71 | HR 89 | Ht 67.0 in | Wt 285.0 lb

## 2024-03-14 DIAGNOSIS — G43109 Migraine with aura, not intractable, without status migrainosus: Secondary | ICD-10-CM | POA: Diagnosis not present

## 2024-03-14 MED ORDER — RIZATRIPTAN BENZOATE 10 MG PO TBDP: 10.0000 mg | ORAL_TABLET | ORAL | 11 refills | Status: AC | PRN

## 2024-03-14 MED ORDER — TOPIRAMATE 50 MG PO TABS: 50.0000 mg | ORAL_TABLET | Freq: Two times a day (BID) | ORAL | 12 refills | Status: DC

## 2024-03-14 MED ORDER — ONDANSETRON 4 MG PO TBDP: 4.0000 mg | ORAL_TABLET | Freq: Three times a day (TID) | ORAL | 3 refills | Status: AC | PRN

## 2024-03-14 NOTE — Patient Instructions (Signed)
MIGRAINE PREVENTION  LIFESTYLE CHANGES -Stop or avoid smoking -Decrease or avoid caffeine / alcohol -Eat and sleep on a regular schedule -Exercise several times per week - start topiramate '50mg'$  at bedtime; after 1-2 weeks increase to '50mg'$  twice a day; drink plenty of water   MIGRAINE RESCUE  - ibuprofen, tylenol as needed - rizatriptan (Maxalt) '10mg'$  as needed for breakthrough headache; may repeat x 1 after 2 hours; max 2 tabs per day or 8 per month

## 2024-03-14 NOTE — Progress Notes (Signed)
 [facility]  PATIENT: Patricia Trujillo DOB: Jun 30, 1991  REFERRING CLINICIAN: Sinda Duel, PA HISTORY FROM: patient  REASON FOR VISIT: new consult   HISTORICAL  CHIEF COMPLAINT:  Chief Complaint  Patient presents with   Migraine    Rm 7 alone Pt is well, reports she has been having migraines 3-4 days a week for the last 6 months. Associated dizziness, sensitivity to light, visual disturbance, pain behind both eyes, nausea.     HISTORY OF PRESENT ILLNESS:   33 year old female here for evaluation of headaches.  For past 7 months patient has had intermittent headaches behind her eyes and back of head.  Sometimes associated with blurred vision.  Sometimes nausea, dizziness and brain fog.  Had a 20-minute episode of severe vision blurriness in March 2025.  Went to ophthalmology in April 2025 and eye exam was normal.  Now having headaches about 2-4 times per week lasting hours at a time.  Excedrin slightly helps.  No visual aura.  No other triggers.   REVIEW OF SYSTEMS: Full 14 system review of systems performed and negative with exception of: as per HPI.  ALLERGIES: Allergies  Allergen Reactions   Hydrocodone Rash   Hydrocodone-Acetaminophen Rash    HOME MEDICATIONS: Outpatient Medications Prior to Visit  Medication Sig Dispense Refill   FLUoxetine (PROZAC) 20 MG capsule 1 capsule     NP THYROID  15 MG tablet Take 15 mg by mouth daily.     pantoprazole (PROTONIX) 40 MG tablet Take 40 mg by mouth daily.     ALPRAZolam (XANAX) 0.25 MG tablet 1 tablet (Patient not taking: Reported on 01/21/2024)     benzonatate (TESSALON) 100 MG capsule Take 100 mg by mouth 3 (three) times daily as needed. (Patient not taking: Reported on 01/21/2024)     cephALEXin  (KEFLEX ) 500 MG capsule Take 1 capsule (500 mg total) by mouth 3 (three) times daily. (Patient not taking: Reported on 01/21/2024) 21 capsule 0   colestipol (COLESTID) 1 g tablet Take by mouth. (Patient not taking: Reported  on 01/21/2024)     doxycycline (VIBRA-TABS) 100 MG tablet Take 100 mg by mouth 2 (two) times daily. (Patient not taking: Reported on 01/21/2024)     fluorouracil  (EFUDEX ) 5 % cream Apply topically 2 (two) times daily. 40 g 0   FLUoxetine (PROZAC) 40 MG capsule Take 40 mg by mouth daily.     imiquimod  (ALDARA ) 5 % cream Apply topically 3 (three) times a week. (Patient not taking: Reported on 01/21/2024) 12 each 0   levothyroxine (SYNTHROID) 75 MCG tablet Take 75 mcg by mouth daily before breakfast.     levothyroxine (SYNTHROID) 75 MCG tablet Take by mouth.     NON FORMULARY [employer] Cream-#10 (Patient not taking: Reported on 03/14/2024)     Salicylic Acid  27.5 % LIQD Apply 1 application topically daily. (Patient not taking: Reported on 01/21/2024) 10 mL 1   Salicylic Acid  27.5 % LIQD Apply topically. (Patient not taking: Reported on 01/21/2024)     Semaglutide-Weight Management (WEGOVY) 0.25 MG/0.5ML SOAJ INJECT 0.25MG  SUBCUTANOUSLY ONCE A WEEK X 4 WEEKS (Patient not taking: Reported on 01/21/2024)     No facility-administered medications prior to visit.    PAST MEDICAL HISTORY: History reviewed. No pertinent past medical history.  PAST SURGICAL HISTORY: Past Surgical History:  Procedure Laterality Date   broken arm Right 2002   EUSTACHIAN TUBE DILATION  1997   KNEE RECONSTRUCTION Left 2009    FAMILY HISTORY: History reviewed.  No pertinent family history.  SOCIAL HISTORY: Social History   Socioeconomic History   Marital status: Unknown    Spouse name: Not on file   Number of children: Not on file   Years of education: Not on file   Highest education level: Not on file  Occupational History   Not on file  Tobacco Use   Smoking status: Unknown   Smokeless tobacco: Never  Substance and Sexual Activity   Alcohol use: Not on file   Drug use: Not on file   Sexual activity: Not on file  Other Topics Concern   Not on file  Social History Narrative   Not on file    Social Drivers of Health   Financial Resource Strain: Not on file  Food Insecurity: Not on file  Transportation Needs: Not on file  Physical Activity: Not on file  Stress: Not on file  Social Connections: Not on file  Intimate Partner Violence: Not on file     PHYSICAL EXAM  GENERAL EXAM/CONSTITUTIONAL: Vitals:  Vitals:   03/14/24 0903  BP: 127/71  Pulse: 89  Weight: 285 lb (129.3 kg)  Height: 5\' 7"  (1.702 m)   Body mass index is 44.64 kg/m. Wt Readings from Last 3 Encounters:  03/14/24 285 lb (129.3 kg)  02/06/24 282 lb (127.9 kg)   Patient is in no distress; well developed, nourished and groomed; neck is supple  CARDIOVASCULAR: Examination of carotid arteries is normal; no carotid bruits Regular rate and rhythm, no murmurs Examination of peripheral vascular system by observation and palpation is normal  EYES: Ophthalmoscopic exam of optic discs and posterior segments is normal; no papilledema or hemorrhages No results found.  MUSCULOSKELETAL: Gait, strength, tone, movements noted in Neurologic exam below  NEUROLOGIC: MENTAL STATUS:      No data to display         awake, alert, oriented to person, place and time recent and remote memory intact normal attention and concentration language fluent, comprehension intact, naming intact fund of knowledge appropriate  CRANIAL NERVE:  2nd - no papilledema on fundoscopic exam 2nd, 3rd, 4th, 6th - pupils equal and reactive to light, visual fields full to confrontation, extraocular muscles intact, no nystagmus 5th - facial sensation symmetric 7th - facial strength symmetric 8th - hearing intact 9th - palate elevates symmetrically, uvula midline 11th - shoulder shrug symmetric 12th - tongue protrusion midline  MOTOR:  normal bulk and tone, full strength in the BUE, BLE  SENSORY:  normal and symmetric to light touch, temperature, vibration  COORDINATION:  finger-nose-finger, fine finger movements  normal  REFLEXES:  deep tendon reflexes present and symmetric  GAIT/STATION:  narrow based gait     DIAGNOSTIC DATA (LABS, IMAGING, TESTING) - I reviewed patient records, labs, notes, testing and imaging myself where available.  No results found for: "WBC", "HGB", "HCT", "MCV", "PLT" No results found for: "NA", "K", "CL", "CO2", "GLUCOSE", "BUN", "CREATININE", "CALCIUM", "PROT", "ALBUMIN", "AST", "ALT", "ALKPHOS", "BILITOT", "GFRNONAA", "GFRAA" No results found for: "CHOL", "HDL", "LDLCALC", "LDLDIRECT", "TRIG", "CHOLHDL" No results found for: "HGBA1C" No results found for: "VITAMINB12" No results found for: "TSH"   02/06/24 MRI brain [I reviewed images myself and agree with interpretation. Slightly enlarged optic nerve sheaths. -[facility]]   1. No evidence of an acute intracranial abnormality. 2. Partially empty sella turcica. This finding can reflect incidental anatomic variation, or alternatively, it can be associated with idiopathic intracranial hypertension (pseudotumor cerebri). 3. Generalized cerebral atrophy, mild but unexpected for age. 4. Otherwise unremarkable  non-contrast MRI appearance of the brain.   ASSESSMENT AND PLAN  33 y.o. year old female here with headaches with migraine features.  Possibility of idiopathic intracranial hypertension is raised based on symptoms, MRI findings and BMI, but apparently detailed funduscopic exam at ophthalmology showed no papilledema.  Will proceed with migraine treatment for now.  If symptoms fail to improve or worsen may consider lumbar puncture for opening pressure measurement.  Dx:  1. Migraine with aura and without status migrainosus, not intractable     PLAN:  MIGRAINE TREATMENT PLAN:  MIGRAINE PREVENTION  LIFESTYLE CHANGES -Stop or avoid smoking -Decrease or avoid caffeine / alcohol -Eat and sleep on a regular schedule -Exercise several times per week - start topiramate 50mg  at bedtime; after 1-2 weeks increase to 50mg   twice a day; drink plenty of water  Consider 2nd line - rimegepant (Nurtec) 75mg  every other day - atogepant (Qulipta) 60mg  daily - erenumab (Aimovig) 70mg  monthly (may increase to 140mg  monthly) - fremanezumab (Ajovy) 225mg  monthly (or 675mg  every 3 months) - galazanezumab (Emgality) 240mg  loading dose; then 120mg  monthly   MIGRAINE RESCUE   - ibuprofen, tylenol as needed - rizatriptan (Maxalt) 10mg  as needed for breakthrough headache; may repeat x 1 after 2 hours; max 2 tabs per day or 8 per month - ondansetron 4mg  every 8 hours as needed for nausea  Consider  - ubrogepant Florette Hurry) 100mg  as needed for breakthrough headache; may repeat x 1 after 2 hours; max 2 tabs per day or 8 per month - rimegepant (Nurtec) 75mg  as needed for breakthrough headache; max 8 per month  Meds ordered this encounter  Medications   topiramate (TOPAMAX) 50 MG tablet    Sig: Take 1 tablet (50 mg total) by mouth 2 (two) times daily.    Dispense:  60 tablet    Refill:  12   rizatriptan (MAXALT-MLT) 10 MG disintegrating tablet    Sig: Take 1 tablet (10 mg total) by mouth as needed for migraine. May repeat in 2 hours if needed    Dispense:  9 tablet    Refill:  11   ondansetron (ZOFRAN-ODT) 4 MG disintegrating tablet    Sig: Take 1 tablet (4 mg total) by mouth every 8 (eight) hours as needed for nausea or vomiting.    Dispense:  20 tablet    Refill:  3   Return in about 4 months (around 07/15/2024) for MyChart visit (15 min).    Omega Bible, MD 03/14/2024, 9:34 AM Certified in Neurology, Neurophysiology and Neuroimaging  [facility] [address] [place], [place] [address] [phone]

## 2024-03-20 ENCOUNTER — Encounter: Payer: Self-pay | Admitting: Diagnostic Neuroimaging

## 2024-03-21 ENCOUNTER — Other Ambulatory Visit: Payer: Self-pay | Admitting: Neurology

## 2024-03-21 ENCOUNTER — Telehealth: Payer: Self-pay | Admitting: Neurology

## 2024-03-21 MED ORDER — QULIPTA 60 MG PO TABS: 60.0000 mg | ORAL_TABLET | Freq: Every day | ORAL | 5 refills | Status: AC

## 2024-03-21 NOTE — Telephone Encounter (Signed)
 PA submitted on [employer]MM/[employer] KEY:[id number] Received an approval from the pharmacy  CaseId:[id number];Status:Approved;Review Type:Prior Auth;Coverage Start Date:03/21/2024;Coverage End Date:03/21/2025;

## 2024-07-18 ENCOUNTER — Telehealth: Payer: Self-pay | Admitting: Diagnostic Neuroimaging

## 2024-07-18 NOTE — Telephone Encounter (Signed)
 LVM and sent mychart msg informing pt of need to reschedule 07/19/24 appt - MD out

## 2024-07-19 ENCOUNTER — Telehealth: Admitting: Diagnostic Neuroimaging

## 2024-08-10 ENCOUNTER — Other Ambulatory Visit: Payer: Self-pay | Admitting: Medical Genetics

## 2024-08-10 DIAGNOSIS — Z006 Encounter for examination for normal comparison and control in clinical research program: Secondary | ICD-10-CM

## 2024-08-20 LAB — GENECONNECT MOLECULAR SCREEN: Genetic Analysis Overall Interpretation: NEGATIVE
